# Patient Record
Sex: Female | Born: 1995 | Race: Black or African American | Hispanic: No | Marital: Single | Smoking: Current some day smoker
Health system: Southern US, Community
[De-identification: ages and names within clinical notes are randomized; demographics above are authoritative.]

## PROBLEM LIST (undated history)

## (undated) ENCOUNTER — Inpatient Hospital Stay (HOSPITAL_COMMUNITY): Payer: Self-pay

## (undated) DIAGNOSIS — I10 Essential (primary) hypertension: Secondary | ICD-10-CM

## (undated) HISTORY — PX: NO PAST SURGERIES: SHX2092

---
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
## 2017-01-27 ENCOUNTER — Inpatient Hospital Stay (HOSPITAL_COMMUNITY): Payer: Managed Care, Other (non HMO)

## 2017-01-27 ENCOUNTER — Other Ambulatory Visit: Payer: Self-pay

## 2017-01-27 ENCOUNTER — Encounter (HOSPITAL_COMMUNITY): Payer: Self-pay | Admitting: *Deleted

## 2017-01-27 ENCOUNTER — Inpatient Hospital Stay (HOSPITAL_COMMUNITY)
Admission: AD | Admit: 2017-01-27 | Discharge: 2017-01-27 | Disposition: A | Discharge status: Home / Self Care | Payer: Managed Care, Other (non HMO) | Source: Ambulatory Visit | Attending: Obstetrics and Gynecology | Admitting: Obstetrics and Gynecology

## 2017-01-27 DIAGNOSIS — O23591 Infection of other part of genital tract in pregnancy, first trimester: Secondary | ICD-10-CM

## 2017-01-27 DIAGNOSIS — N898 Other specified noninflammatory disorders of vagina: Secondary | ICD-10-CM | POA: Insufficient documentation

## 2017-01-27 DIAGNOSIS — O209 Hemorrhage in early pregnancy, unspecified: Secondary | ICD-10-CM | POA: Insufficient documentation

## 2017-01-27 DIAGNOSIS — O26891 Other specified pregnancy related conditions, first trimester: Secondary | ICD-10-CM | POA: Insufficient documentation

## 2017-01-27 DIAGNOSIS — R109 Unspecified abdominal pain: Secondary | ICD-10-CM | POA: Insufficient documentation

## 2017-01-27 DIAGNOSIS — Z3A01 Less than 8 weeks gestation of pregnancy: Secondary | ICD-10-CM | POA: Diagnosis not present

## 2017-01-27 DIAGNOSIS — Z3491 Encounter for supervision of normal pregnancy, unspecified, first trimester: Secondary | ICD-10-CM

## 2017-01-27 DIAGNOSIS — O98311 Other infections with a predominantly sexual mode of transmission complicating pregnancy, first trimester: Secondary | ICD-10-CM | POA: Insufficient documentation

## 2017-01-27 DIAGNOSIS — A5901 Trichomonal vulvovaginitis: Secondary | ICD-10-CM | POA: Insufficient documentation

## 2017-01-27 DIAGNOSIS — M549 Dorsalgia, unspecified: Secondary | ICD-10-CM | POA: Insufficient documentation

## 2017-01-27 HISTORY — DX: Essential (primary) hypertension: I10

## 2017-01-27 LAB — ABO/RH: ABO/RH(D): O POS

## 2017-01-27 LAB — WET PREP, GENITAL
CLUE CELLS WET PREP: NONE SEEN
Sperm: NONE SEEN
YEAST WET PREP: NONE SEEN

## 2017-01-27 LAB — URINALYSIS, ROUTINE W REFLEX MICROSCOPIC
Bilirubin Urine: NEGATIVE
GLUCOSE, UA: NEGATIVE mg/dL
Hgb urine dipstick: NEGATIVE
Ketones, ur: 5 mg/dL — AB
Nitrite: NEGATIVE
PH: 7 (ref 5.0–8.0)
Protein, ur: NEGATIVE mg/dL
Specific Gravity, Urine: 1.011 (ref 1.005–1.030)

## 2017-01-27 LAB — CBC
HCT: 36.4 % (ref 36.0–46.0)
HEMOGLOBIN: 11.9 g/dL — AB (ref 12.0–15.0)
MCH: 30.4 pg (ref 26.0–34.0)
MCHC: 32.7 g/dL (ref 30.0–36.0)
MCV: 93.1 fL (ref 78.0–100.0)
Platelets: 295 10*3/uL (ref 150–400)
RBC: 3.91 MIL/uL (ref 3.87–5.11)
RDW: 13.2 % (ref 11.5–15.5)
WBC: 4.8 10*3/uL (ref 4.0–10.5)

## 2017-01-27 LAB — POCT PREGNANCY, URINE: Preg Test, Ur: POSITIVE — AB

## 2017-01-27 LAB — HCG, QUANTITATIVE, PREGNANCY: hCG, Beta Chain, Quant, S: 45867 m[IU]/mL — ABNORMAL HIGH (ref <5)

## 2017-01-27 LAB — OB RESULTS CONSOLE GC/CHLAMYDIA: Gonorrhea: NEGATIVE

## 2017-01-27 MED ORDER — METRONIDAZOLE 500 MG PO TABS
2000.0000 mg | ORAL_TABLET | Freq: Once | ORAL | Status: AC
  Administered 2017-01-27: 2000 mg via ORAL
  Filled 2017-01-27: qty 4

## 2017-01-27 NOTE — MAU Note (Signed)
Just had preg confirmed at HD.  Having pain in low back and lower abd, cramping and feels like stabbing and has been kicked.

## 2017-01-27 NOTE — MAU Provider Note (Signed)
History     CSN: [id number]  Arrival date and time: 01/27/17 1209   First Provider Initiated Contact with Patient 01/27/17 1414      Chief Complaint  Patient presents with  . Abdominal Pain  . Back Pain  . Possible Pregnancy   HPI Shelley Guzman is a 21 y.o. G1P0 at 6328w3d by LMP who presents with abdominal pain. Reports lower abdominal cramping since 10/29. Pain has been constant. Radiates from bilateral lower abdomen around to lower back. Describes as sharp pain. Rates pain 4/10. Has been taking tylenol with mild relief. Endorses vaginal discharge. Describes as thin white discharge, no odor. Denies vaginal irritation or itching. Denies vaginal bleeding.   OB History    Gravida Para Term Preterm AB Living   1             SAB TAB Ectopic Multiple Live Births                  No past medical history on file.  No past surgical history on file.  No family history on file.  Social History   Tobacco Use  . Smoking status: Not on file  Substance Use Topics  . Alcohol use: Not on file  . Drug use: Not on file    Allergies: Allergies not on file  No medications prior to admission.    Review of Systems  Constitutional: Negative.   Gastrointestinal: Positive for abdominal pain. Negative for constipation, diarrhea, nausea and vomiting.  Genitourinary: Positive for vaginal discharge. Negative for dyspareunia, dysuria, vaginal bleeding and vaginal pain.   Physical Exam   Blood pressure 125/60, pulse 73, temperature 98.3 F (36.8 C), temperature source Oral, resp. rate 16, weight 280 lb 12 oz (127.3 kg), last menstrual period 12/06/2016, SpO2 100 %.  Physical Exam  Nursing note and vitals reviewed. Constitutional: She is oriented to person, place, and time. She appears well-developed and well-nourished. No distress.  HENT:  Head: Normocephalic and atraumatic.  Eyes: Conjunctivae are normal. Right eye exhibits no discharge. Left eye exhibits no discharge. No scleral  icterus.  Neck: Normal range of motion.  Respiratory: Effort normal. No respiratory distress.  GI: Soft. There is no tenderness.  Genitourinary: Uterus normal. Cervix exhibits no motion tenderness and no friability. Right adnexum displays no mass and no tenderness. Left adnexum displays no mass and no tenderness. No bleeding in the vagina. Vaginal discharge (moderate amount of yellow frothy discharge) found.  Genitourinary Comments: Cervix closed  Neurological: She is alert and oriented to person, place, and time.  Skin: Skin is warm and dry. She is not diaphoretic.  Psychiatric: She has a normal mood and affect. Her behavior is normal. Judgment and thought content normal.    MAU Course  Procedures Results for orders placed or performed during the hospital encounter of 01/27/17 (from the past 24 hour(s))  Urinalysis, Routine w reflex microscopic     Status: Abnormal   Collection Time: 01/27/17 12:39 PM  Result Value Ref Range   Color, Urine YELLOW YELLOW   APPearance HAZY (A) CLEAR   Specific Gravity, Urine 1.011 1.005 - 1.030   pH 7.0 5.0 - 8.0   Glucose, UA NEGATIVE NEGATIVE mg/dL   Hgb urine dipstick NEGATIVE NEGATIVE   Bilirubin Urine NEGATIVE NEGATIVE   Ketones, ur 5 (A) NEGATIVE mg/dL   Protein, ur NEGATIVE NEGATIVE mg/dL   Nitrite NEGATIVE NEGATIVE   Leukocytes, UA LARGE (A) NEGATIVE   RBC / HPF 0-5 0 - 5 RBC/hpf  WBC, UA 6-30 0 - 5 WBC/hpf   Bacteria, UA FEW (A) NONE SEEN   Squamous Epithelial / LPF 6-30 (A) NONE SEEN   Mucus PRESENT   Pregnancy, urine POC     Status: Abnormal   Collection Time: 01/27/17 12:53 PM  Result Value Ref Range   Preg Test, Ur POSITIVE (A) NEGATIVE  CBC     Status: Abnormal   Collection Time: 01/27/17  1:36 PM  Result Value Ref Range   WBC 4.8 4.0 - 10.5 K/uL   RBC 3.91 3.87 - 5.11 MIL/uL   Hemoglobin 11.9 (L) 12.0 - 15.0 g/dL   HCT 40.936.4 81.136.0 - 91.446.0 %   MCV 93.1 78.0 - 100.0 fL   MCH 30.4 26.0 - 34.0 pg   MCHC 32.7 30.0 - 36.0 g/dL    RDW 78.213.2 95.611.5 - 21.315.5 %   Platelets 295 150 - 400 K/uL  ABO/Rh     Status: None (Preliminary result)   Collection Time: 01/27/17  1:36 PM  Result Value Ref Range   ABO/RH(D) O POS   hCG, quantitative, pregnancy     Status: Abnormal   Collection Time: 01/27/17  1:36 PM  Result Value Ref Range   hCG, Beta Chain, Quant, S 45,867 (H) <5 mIU/mL  Wet prep, genital     Status: Abnormal   Collection Time: 01/27/17  2:24 PM  Result Value Ref Range   Yeast Wet Prep HPF POC NONE SEEN NONE SEEN   Trich, Wet Prep PRESENT (A) NONE SEEN   Clue Cells Wet Prep HPF POC NONE SEEN NONE SEEN   WBC, Wet Prep HPF POC MODERATE (A) NONE SEEN   Sperm NONE SEEN    [place] Ob Comp Less 14 Wks  Result Date: 01/27/2017 CLINICAL DATA:  21 year old pregnant female with low back and abdominal pain. Quantitative beta HCG [id number]. EDC by LMP: 09/12/2017, projecting to an expected gestational age of [redacted] weeks 3 days. EXAM: OBSTETRIC <14 WK US AND TRANSVAGINAL OB US TECHNIQUE: Both transabdominal and transvaginal ultrasound examinations were performed for complete evaluation of the gestation as well as the maternal uterus, adnexal regions, and pelvic cul-de-sac. Transvaginal technique was performed to assess early pregnancy. COMPARISON:  None. FINDINGS: Intrauterine gestational sac: Single intrauterine gestational sac is normal in size, shape and position. Yolk sac:  Visualized. Embryo:  Visualized. Embryonic Cardiac Activity: Regular rate and rhythm. Embryonic Heart Rate: 161  bpm CRL:  15.3  mm   7 w   6 d                  US EDC: 09/09/2017 Subchorionic hemorrhage: Small perigestational bleed in the lower cavity measures 1.6 x 0.7 x 0.7 cm. Maternal uterus/adnexae: Right ovary measures 2.8 x 1.9 x 1.6 cm. Left ovary is not visualized. No abnormal adnexal masses. No abnormal free fluid in the pelvis. IMPRESSION: 1. Single living intrauterine gestation at 7 weeks 6 days by crown-rump length, concordant with provided menstrual dating. 2.  Small perigestational bleed in the lower cavity. Normal embryonic cardiac activity . 3. Normal right ovary. Nonvisualization of left ovary. No adnexal masses. Electronically Signed   By: Delbert PhenixJason A Poff M.D.   On: 01/27/2017 16:06   [place] Ob Transvaginal  Result Date: 01/27/2017 CLINICAL DATA:  21 year old pregnant female with low back and abdominal pain. Quantitative beta HCG [id number]. EDC by LMP: 09/12/2017, projecting to an expected gestational age of [redacted] weeks 3 days. EXAM: OBSTETRIC <14 WK US AND TRANSVAGINAL OB US TECHNIQUE: Both transabdominal  and transvaginal ultrasound examinations were performed for complete evaluation of the gestation as well as the maternal uterus, adnexal regions, and pelvic cul-de-sac. Transvaginal technique was performed to assess early pregnancy. COMPARISON:  None. FINDINGS: Intrauterine gestational sac: Single intrauterine gestational sac is normal in size, shape and position. Yolk sac:  Visualized. Embryo:  Visualized. Embryonic Cardiac Activity: Regular rate and rhythm. Embryonic Heart Rate: 161  bpm CRL:  15.3  mm   7 w   6 d                  [place] EDC: 09/09/2017 Subchorionic hemorrhage: Small perigestational bleed in the lower cavity measures 1.6 x 0.7 x 0.7 cm. Maternal uterus/adnexae: Right ovary measures 2.8 x 1.9 x 1.6 cm. Left ovary is not visualized. No abnormal adnexal masses. No abnormal free fluid in the pelvis. IMPRESSION: 1. Single living intrauterine gestation at 7 weeks 6 days by crown-rump length, concordant with provided menstrual dating. 2. Small perigestational bleed in the lower cavity. Normal embryonic cardiac activity . 3. Normal right ovary. Nonvisualization of left ovary. No adnexal masses. Electronically Signed   By: Delbert Phenix M.D.   On: 01/27/2017 16:06     MDM +UPT UA, wet prep, GC/chlamydia, CBC, ABO/Rh, quant hCG, HIV, and [place] today to rule out ectopic pregnancy O positive Ultrasound shows SIUP with cardiac activity Wet prep + trich Flagyl 2 gm PO  given in MAU   Assessment and Plan  A: 1. Normal IUP (intrauterine pregnancy) on prenatal ultrasound, first trimester   2. Abdominal pain during pregnancy in first trimester   3. Less than [redacted] weeks gestation of pregnancy   4. Trichomonal vaginitis during pregnancy in first trimester    P: Discharge home EPT rx & info sheet given No intercourse x 1 week after treatment Discussed reasons to return to MAU Start prenatal care GC/CT pending   Judeth Horn 01/27/2017, 2:14 PM

## 2017-01-27 NOTE — Discharge Instructions (Signed)
Trichomoniasis °Trichomoniasis is an STI (sexually transmitted infection) that can affect both women and men. In women, the outer area of the female genitalia (vulva) and the vagina are affected. In men, the penis is mainly affected, but the prostate and other reproductive organs can also be involved. This condition can be treated with medicine. It often has no symptoms (is asymptomatic), especially in men. °What are the causes? °This condition is caused by an organism called Trichomonas vaginalis. Trichomoniasis most often spreads from person to person (is contagious) through sexual contact. °What increases the risk? °The following factors may make you more likely to develop this condition: °· Having unprotected sexual intercourse. °· Having sexual intercourse with a partner who has trichomoniasis. °· Having multiple sexual partners. °· Having had previous trichomoniasis infections or other STIs. ° °What are the signs or symptoms? °In women, symptoms of trichomoniasis include: °· Abnormal vaginal discharge that is clear, white, gray, or yellow-green and foamy and has an unusual "fishy" odor. °· Itching and irritation of the vagina and vulva. °· Burning or pain during urination or sexual intercourse. °· Genital redness and swelling. ° °In men, symptoms of trichomoniasis include: °· Penile discharge that may be foamy or contain pus. °· Pain in the penis. This may happen only when urinating. °· Itching or irritation inside the penis. °· Burning after urination or ejaculation. ° °How is this diagnosed? °In women, this condition may be found during a routine Pap test or physical exam. It may be found in men during a routine physical exam. Your health care provider may perform tests to help diagnose this infection, such as: °· Urine tests (men and women). °· The following in women: °? Testing the pH of the vagina. °? A vaginal swab test that checks for the Trichomonas vaginalis organism. °? Testing vaginal  secretions. ° °Your health care provider may test you for other STIs, including HIV (human immunodeficiency virus). °How is this treated? °This condition is treated with medicine taken by mouth (orally), such as metronidazole or tinidazole to fight the infection. Your sexual partner(s) may also need to be tested and treated. °· If you are a woman and you plan to become pregnant or think you may be pregnant, tell your health care provider right away. Some medicines that are used to treat the infection should not be taken during pregnancy. ° °Your health care provider may recommend over-the-counter medicines or creams to help relieve itching or irritation. You may be tested for infection again 3 months after treatment. °Follow these instructions at home: °· Take and use over-the-counter and prescription medicines, including creams, only as told by your health care provider. °· Do not have sexual intercourse until one week after you finish your medicine, or until your health care provider approves. Ask your health care provider when you may resume sexual intercourse. °· (Women) Do not douche or wear tampons while you have the infection. °· Discuss your infection with your sexual partner(s). Make sure that your partner gets tested and treated, if necessary. °· Keep all follow-up visits as told by your health care provider. This is important. °How is this prevented? °· Use condoms every time you have sex. Using condoms correctly and consistently can help protect against STIs. °· Avoid having multiple sexual partners. °· Talk with your sexual partner about any symptoms that either of you may have, as well as any history of STIs. °· Get tested for STIs and STDs (sexually transmitted diseases) before you have sex. Ask your partner   to do the same.  Do not have sexual contact if you have symptoms of trichomoniasis or another STI. Contact a health care provider if:  You still have symptoms after you finish your  medicine.  You develop pain in your abdomen.  You have pain when you urinate.  You have bleeding after sexual intercourse.  You develop a rash.  You feel nauseous or you vomit.  You plan to become pregnant or think you may be pregnant. Summary  Trichomoniasis is an STI (sexually transmitted infection) that can affect both women and men.  This condition often has no symptoms (is asymptomatic), especially in men.  You should not have sexual intercourse until one week after you finish your medicine, or until your health care provider approves. Ask your health care provider when you may resume sexual intercourse.  Discuss your infection with your sexual partner. Make sure that your partner gets tested and treated, if necessary. This information is not intended to replace advice given to you by your health care provider. Make sure you discuss any questions you have with your health care provider. Document Released: 08/24/2000 Document Revised: 01/22/2016 Document Reviewed: 01/22/2016 Elsevier Interactive Patient Education  2017 [employer] First Trimester of Pregnancy The first trimester of pregnancy is from week 1 until the end of week 13 (months 1 through 3). A week after a sperm fertilizes an egg, the egg will implant on the wall of the uterus. This embryo will begin to develop into a baby. Genes from you and your partner will form the baby. The female genes will determine whether the baby will be a boy or a girl. At 6-8 weeks, the eyes and face will be formed, and the heartbeat can be seen on ultrasound. At the end of 12 weeks, all the baby's organs will be formed. Now that you are pregnant, you will want to do everything you can to have a healthy baby. Two of the most important things are to get good prenatal care and to follow your health care provider's instructions. Prenatal care is all the medical care you receive before the baby's birth. This care will help prevent, find, and treat  any problems during the pregnancy and childbirth. Body changes during your first trimester Your body goes through many changes during pregnancy. The changes vary from woman to woman.  You may gain or lose a couple of pounds at first.  You may feel sick to your stomach (nauseous) and you may throw up (vomit). If the vomiting is uncontrollable, call your health care provider.  You may tire easily.  You may develop headaches that can be relieved by medicines. All medicines should be approved by your health care provider.  You may urinate more often. Painful urination may mean you have a bladder infection.  You may develop heartburn as a result of your pregnancy.  You may develop constipation because certain hormones are causing the muscles that push stool through your intestines to slow down.  You may develop hemorrhoids or swollen veins (varicose veins).  Your breasts may begin to grow larger and become tender. Your nipples may stick out more, and the tissue that surrounds them (areola) may become darker.  Your gums may bleed and may be sensitive to brushing and flossing.  Dark spots or blotches (chloasma, mask of pregnancy) may develop on your face. This will likely fade after the baby is born.  Your menstrual periods will stop.  You may have a loss of appetite.  You  may develop cravings for certain kinds of food.  You may have changes in your emotions from day to day, such as being excited to be pregnant or being concerned that something may go wrong with the pregnancy and baby.  You may have more vivid and strange dreams.  You may have changes in your hair. These can include thickening of your hair, rapid growth, and changes in texture. Some women also have hair loss during or after pregnancy, or hair that feels dry or thin. Your hair will most likely return to normal after your baby is born.  What to expect at prenatal visits During a routine prenatal visit:  You will be  weighed to make sure you and the baby are growing normally.  Your blood pressure will be taken.  Your abdomen will be measured to track your baby's growth.  The fetal heartbeat will be listened to between weeks 10 and 14 of your pregnancy.  Test results from any previous visits will be discussed.  Your health care provider may ask you:  How you are feeling.  If you are feeling the baby move.  If you have had any abnormal symptoms, such as leaking fluid, bleeding, severe headaches, or abdominal cramping.  If you are using any tobacco products, including cigarettes, chewing tobacco, and electronic cigarettes.  If you have any questions.  Other tests that may be performed during your first trimester include:  Blood tests to find your blood type and to check for the presence of any previous infections. The tests will also be used to check for low iron levels (anemia) and protein on red blood cells (Rh antibodies). Depending on your risk factors, or if you previously had diabetes during pregnancy, you may have tests to check for high blood sugar that affects pregnant women (gestational diabetes).  Urine tests to check for infections, diabetes, or protein in the urine.  An ultrasound to confirm the proper growth and development of the baby.  Fetal screens for spinal cord problems (spina bifida) and Down syndrome.  HIV (human immunodeficiency virus) testing. Routine prenatal testing includes screening for HIV, unless you choose not to have this test.  You may need other tests to make sure you and the baby are doing well.  Follow these instructions at home: Medicines  Follow your health care provider's instructions regarding medicine use. Specific medicines may be either safe or unsafe to take during pregnancy.  Take a prenatal vitamin that contains at least 600 micrograms (mcg) of folic acid.  If you develop constipation, try taking a stool softener if your health care provider  approves. Eating and drinking  Eat a balanced diet that includes fresh fruits and vegetables, whole grains, good sources of protein such as meat, eggs, or tofu, and low-fat dairy. Your health care provider will help you determine the amount of weight gain that is right for you.  Avoid raw meat and uncooked cheese. These carry germs that can cause birth defects in the baby.  Eating four or five small meals rather than three large meals a day may help relieve nausea and vomiting. If you start to feel nauseous, eating a few soda crackers can be helpful. Drinking liquids between meals, instead of during meals, also seems to help ease nausea and vomiting.  Limit foods that are high in fat and processed sugars, such as fried and sweet foods.  To prevent constipation: ? Eat foods that are high in fiber, such as fresh fruits and vegetables, whole grains,  and beans. ? Drink enough fluid to keep your urine clear or pale yellow. Activity  Exercise only as directed by your health care provider. Most women can continue their usual exercise routine during pregnancy. Try to exercise for 30 minutes at least 5 days a week. Exercising will help you: ? Control your weight. ? Stay in shape. ? Be prepared for labor and delivery.  Experiencing pain or cramping in the lower abdomen or lower back is a good sign that you should stop exercising. Check with your health care provider before continuing with normal exercises.  Try to avoid standing for long periods of time. Move your legs often if you must stand in one place for a long time.  Avoid heavy lifting.  Wear low-heeled shoes and practice good posture.  You may continue to have sex unless your health care provider tells you not to. Relieving pain and discomfort  Wear a good support bra to relieve breast tenderness.  Take warm sitz baths to soothe any pain or discomfort caused by hemorrhoids. Use hemorrhoid cream if your health care provider  approves.  Rest with your legs elevated if you have leg cramps or low back pain.  If you develop varicose veins in your legs, wear support hose. Elevate your feet for 15 minutes, 3-4 times a day. Limit salt in your diet. Prenatal care  Schedule your prenatal visits by the twelfth week of pregnancy. They are usually scheduled monthly at first, then more often in the last 2 months before delivery.  Write down your questions. Take them to your prenatal visits.  Keep all your prenatal visits as told by your health care provider. This is important. Safety  Wear your seat belt at all times when driving.  Make a list of emergency phone numbers, including numbers for family, friends, the hospital, and police and fire departments. General instructions  Ask your health care provider for a referral to a local prenatal education class. Begin classes no later than the beginning of month 6 of your pregnancy.  Ask for help if you have counseling or nutritional needs during pregnancy. Your health care provider can offer advice or refer you to specialists for help with various needs.  Do not use hot tubs, steam rooms, or saunas.  Do not douche or use tampons or scented sanitary pads.  Do not cross your legs for long periods of time.  Avoid cat litter boxes and soil used by cats. These carry germs that can cause birth defects in the baby and possibly loss of the fetus by miscarriage or stillbirth.  Avoid all smoking, herbs, alcohol, and medicines not prescribed by your health care provider. Chemicals in these products affect the formation and growth of the baby.  Do not use any products that contain nicotine or tobacco, such as cigarettes and e-cigarettes. If you need help quitting, ask your health care provider. You may receive counseling support and other resources to help you quit.  Schedule a dentist appointment. At home, brush your teeth with a soft toothbrush and be gentle when you  floss. Contact a health care provider if:  You have dizziness.  You have mild pelvic cramps, pelvic pressure, or nagging pain in the abdominal area.  You have persistent nausea, vomiting, or diarrhea.  You have a bad smelling vaginal discharge.  You have pain when you urinate.  You notice increased swelling in your face, hands, legs, or ankles.  You are exposed to fifth disease or chickenpox.  You are  exposed to [place] measles (rubella) and have never had it. Get help right away if:  You have a fever.  You are leaking fluid from your vagina.  You have spotting or bleeding from your vagina.  You have severe abdominal cramping or pain.  You have rapid weight gain or loss.  You vomit blood or material that looks like coffee grounds.  You develop a severe headache.  You have shortness of breath.  You have any kind of trauma, such as from a fall or a car accident. Summary  The first trimester of pregnancy is from week 1 until the end of week 13 (months 1 through 3).  Your body goes through many changes during pregnancy. The changes vary from woman to woman.  You will have routine prenatal visits. During those visits, your health care provider will examine you, discuss any test results you may have, and talk with you about how you are feeling. This information is not intended to replace advice given to you by your health care provider. Make sure you discuss any questions you have with your health care provider. Document Released: 02/22/2001 Document Revised: 02/10/2016 Document Reviewed: 02/10/2016 Elsevier Interactive Patient Education  2017 [employer].      [facility] Providers   [facility]       Phone: [phone]  [facility] for [facility] [place] Phone: [phone]  [facility] [place]  Phone: [phone]  [facility] for [facility]  Phone:  [phone]  [facility] [place]  Phone: [phone]  [facility]       Phone: [phone]  [facility]    Phone: [phone]   [facility] [place]    Phone: [phone]  [facility]    Phone: [phone]  [facility]    Phone: [phone]   [facility]  Phone: [phone]  [facility]    Phone: [phone]  [facility]   Phone: [phone]  [facility]    Phone: [phone]

## 2017-01-30 LAB — GC/CHLAMYDIA PROBE AMP (~~LOC~~) NOT AT ARMC
CHLAMYDIA, DNA PROBE: NEGATIVE
NEISSERIA GONORRHEA: NEGATIVE

## 2017-03-14 NOTE — L&D Delivery Note (Signed)
Patient is 22 y.o. G1P0 6918w1d admitted for SROM   Delivery Note At 8:12 PM a viable female was delivered via Vaginal, Spontaneous (Presentation: OA;  ).  APGAR: 7, 8; weight 8 lb 0.4 oz (3640 g).   Placenta status: delievered intact ,trailing membranes .  Cord: 3V   Anesthesia:  Lidocaine 1% w/o epinephrine Episiotomy: None Lacerations: Perineal;1st degree, periclitoral Suture Repair: 3.0 and 4.0 monocryl Est. Blood Loss (mL):  680 mL  Mom to postpartum.  Baby to Couplet care / Skin to Skin.  Upon arrival patient was complete and pushing. She pushed with good maternal effort to deliver a healthy baby boy. Baby delivered without difficulty, was noted to have good tone and place on maternal abdomen for oral suctioning, drying and stimulation. Delayed cord clamping performed. Placenta delivered intact with 3V cord. Vaginal canal and perineum was inspected and 1st degree and periclitoral lacerations were found. A rubber catheter was placed, then the periclitoral laceration was repaired using 4.0 monocryl with 4 interrupted sutures. The 1st degree perineal laceration was repaired with a running suture using 3.0 monocryl. Both lacerations were hemostatic after repair. Pitocin was started and uterus massaged until bleeding slowed. Counts of sharps, instruments, and lap pads were all correct.   Garnette GunnerAaron B Thompson, MD PGY-1 6/19/20199:47 PM

## 2017-03-21 LAB — OB RESULTS CONSOLE RPR: RPR: NONREACTIVE

## 2017-03-21 LAB — OB RESULTS CONSOLE ANTIBODY SCREEN: ANTIBODY SCREEN: NEGATIVE

## 2017-03-21 LAB — OB RESULTS CONSOLE RUBELLA ANTIBODY, IGM: Rubella: IMMUNE

## 2017-03-21 LAB — OB RESULTS CONSOLE ABO/RH: RH Type: POSITIVE

## 2017-03-21 LAB — OB RESULTS CONSOLE HIV ANTIBODY (ROUTINE TESTING): HIV: NONREACTIVE

## 2017-03-21 LAB — OB RESULTS CONSOLE HEPATITIS B SURFACE ANTIGEN: HEP B S AG: NEGATIVE

## 2017-08-30 ENCOUNTER — Inpatient Hospital Stay (HOSPITAL_COMMUNITY)
Admission: AD | Admit: 2017-08-30 | Discharge: 2017-09-01 | DRG: 806 | Disposition: A | Discharge status: Home / Self Care | Payer: Medicaid Other | Attending: Obstetrics & Gynecology | Admitting: Obstetrics & Gynecology

## 2017-08-30 ENCOUNTER — Other Ambulatory Visit: Payer: Self-pay

## 2017-08-30 ENCOUNTER — Encounter (HOSPITAL_COMMUNITY): Payer: Self-pay

## 2017-08-30 DIAGNOSIS — D62 Acute posthemorrhagic anemia: Secondary | ICD-10-CM | POA: Diagnosis not present

## 2017-08-30 DIAGNOSIS — O99324 Drug use complicating childbirth: Secondary | ICD-10-CM | POA: Diagnosis present

## 2017-08-30 DIAGNOSIS — F1290 Cannabis use, unspecified, uncomplicated: Secondary | ICD-10-CM | POA: Diagnosis present

## 2017-08-30 DIAGNOSIS — O99334 Smoking (tobacco) complicating childbirth: Secondary | ICD-10-CM | POA: Diagnosis present

## 2017-08-30 DIAGNOSIS — O26893 Other specified pregnancy related conditions, third trimester: Secondary | ICD-10-CM | POA: Diagnosis present

## 2017-08-30 DIAGNOSIS — R030 Elevated blood-pressure reading, without diagnosis of hypertension: Secondary | ICD-10-CM | POA: Diagnosis not present

## 2017-08-30 DIAGNOSIS — Z3A38 38 weeks gestation of pregnancy: Secondary | ICD-10-CM | POA: Diagnosis not present

## 2017-08-30 DIAGNOSIS — O99824 Streptococcus B carrier state complicating childbirth: Principal | ICD-10-CM | POA: Diagnosis present

## 2017-08-30 DIAGNOSIS — O9081 Anemia of the puerperium: Secondary | ICD-10-CM | POA: Diagnosis not present

## 2017-08-30 DIAGNOSIS — F17290 Nicotine dependence, other tobacco product, uncomplicated: Secondary | ICD-10-CM | POA: Diagnosis present

## 2017-08-30 DIAGNOSIS — Z3483 Encounter for supervision of other normal pregnancy, third trimester: Secondary | ICD-10-CM | POA: Diagnosis present

## 2017-08-30 LAB — CBC
HEMATOCRIT: 36.7 % (ref 36.0–46.0)
HEMOGLOBIN: 12.3 g/dL (ref 12.0–15.0)
MCH: 30.6 pg (ref 26.0–34.0)
MCHC: 33.5 g/dL (ref 30.0–36.0)
MCV: 91.3 fL (ref 78.0–100.0)
Platelets: 332 10*3/uL (ref 150–400)
RBC: 4.02 MIL/uL (ref 3.87–5.11)
RDW: 13.4 % (ref 11.5–15.5)
WBC: 9.4 10*3/uL (ref 4.0–10.5)

## 2017-08-30 LAB — TYPE AND SCREEN
ABO/RH(D): O POS
ANTIBODY SCREEN: NEGATIVE

## 2017-08-30 MED ORDER — SENNOSIDES-DOCUSATE SODIUM 8.6-50 MG PO TABS
2.0000 | ORAL_TABLET | ORAL | Status: DC
  Administered 2017-08-30 – 2017-08-31 (×2): 2 via ORAL
  Filled 2017-08-30 (×2): qty 2

## 2017-08-30 MED ORDER — LACTATED RINGERS IV SOLN
INTRAVENOUS | Status: DC
  Administered 2017-08-30: 19:00:00 via INTRAVENOUS

## 2017-08-30 MED ORDER — OXYTOCIN BOLUS FROM INFUSION
500.0000 mL | Freq: Once | INTRAVENOUS | Status: AC
  Administered 2017-08-30: 500 mL via INTRAVENOUS

## 2017-08-30 MED ORDER — FLEET ENEMA 7-19 GM/118ML RE ENEM: 1.0000 | ENEMA | RECTAL | Status: DC | PRN

## 2017-08-30 MED ORDER — SODIUM CHLORIDE 0.9 % IV SOLN
5.0000 10*6.[IU] | Freq: Once | INTRAVENOUS | Status: AC
  Administered 2017-08-30: 5 10*6.[IU] via INTRAVENOUS
  Filled 2017-08-30: qty 5

## 2017-08-30 MED ORDER — DIPHENHYDRAMINE HCL 25 MG PO CAPS
25.0000 mg | ORAL_CAPSULE | Freq: Four times a day (QID) | ORAL | Status: DC | PRN
Start: 2017-08-30 — End: 2017-09-02

## 2017-08-30 MED ORDER — PENICILLIN G POT IN DEXTROSE 60000 UNIT/ML IV SOLN
3.0000 10*6.[IU] | INTRAVENOUS | Status: DC
  Filled 2017-08-30: qty 50

## 2017-08-30 MED ORDER — COCONUT OIL OIL: 1.0000 "application " | TOPICAL_OIL | Status: DC | PRN

## 2017-08-30 MED ORDER — DIBUCAINE 1 % RE OINT: 1.0000 "application " | TOPICAL_OINTMENT | RECTAL | Status: DC | PRN

## 2017-08-30 MED ORDER — OXYCODONE-ACETAMINOPHEN 5-325 MG PO TABS: 2.0000 | ORAL_TABLET | ORAL | Status: DC | PRN

## 2017-08-30 MED ORDER — ZOLPIDEM TARTRATE 5 MG PO TABS: 5.0000 mg | ORAL_TABLET | Freq: Every evening | ORAL | Status: DC | PRN

## 2017-08-30 MED ORDER — ACETAMINOPHEN 325 MG PO TABS: 650.0000 mg | ORAL_TABLET | ORAL | Status: DC | PRN

## 2017-08-30 MED ORDER — IBUPROFEN 600 MG PO TABS
600.0000 mg | ORAL_TABLET | Freq: Four times a day (QID) | ORAL | Status: DC
  Administered 2017-08-30 – 2017-09-01 (×8): 600 mg via ORAL
  Filled 2017-08-30 (×8): qty 1

## 2017-08-30 MED ORDER — SOD CITRATE-CITRIC ACID 500-334 MG/5ML PO SOLN: 30.0000 mL | ORAL | Status: DC | PRN

## 2017-08-30 MED ORDER — LACTATED RINGERS IV SOLN: 500.0000 mL | INTRAVENOUS | Status: DC | PRN

## 2017-08-30 MED ORDER — OXYCODONE-ACETAMINOPHEN 5-325 MG PO TABS: 1.0000 | ORAL_TABLET | ORAL | Status: DC | PRN

## 2017-08-30 MED ORDER — ONDANSETRON HCL 4 MG PO TABS: 4.0000 mg | ORAL_TABLET | ORAL | Status: DC | PRN

## 2017-08-30 MED ORDER — PRENATAL MULTIVITAMIN CH
1.0000 | ORAL_TABLET | Freq: Every day | ORAL | Status: DC
  Administered 2017-08-31 – 2017-09-01 (×2): 1 via ORAL
  Filled 2017-08-30: qty 1

## 2017-08-30 MED ORDER — LIDOCAINE HCL (PF) 1 % IJ SOLN
30.0000 mL | INTRAMUSCULAR | Status: DC | PRN
  Administered 2017-08-30: 30 mL via SUBCUTANEOUS
  Filled 2017-08-30: qty 30

## 2017-08-30 MED ORDER — ONDANSETRON HCL 4 MG/2ML IJ SOLN
4.0000 mg | Freq: Four times a day (QID) | INTRAMUSCULAR | Status: DC | PRN
  Administered 2017-08-30: 4 mg via INTRAVENOUS
  Filled 2017-08-30: qty 2

## 2017-08-30 MED ORDER — SIMETHICONE 80 MG PO CHEW: 80.0000 mg | CHEWABLE_TABLET | ORAL | Status: DC | PRN

## 2017-08-30 MED ORDER — BENZOCAINE-MENTHOL 20-0.5 % EX AERO
1.0000 "application " | INHALATION_SPRAY | CUTANEOUS | Status: DC | PRN
  Administered 2017-09-01: 1 via TOPICAL
  Filled 2017-08-30 (×2): qty 56

## 2017-08-30 MED ORDER — ONDANSETRON HCL 4 MG/2ML IJ SOLN: 4.0000 mg | INTRAMUSCULAR | Status: DC | PRN

## 2017-08-30 MED ORDER — WITCH HAZEL-GLYCERIN EX PADS: 1.0000 "application " | MEDICATED_PAD | CUTANEOUS | Status: DC | PRN

## 2017-08-30 MED ORDER — OXYTOCIN 40 UNITS IN LACTATED RINGERS INFUSION - SIMPLE MED
2.5000 [IU]/h | INTRAVENOUS | Status: DC
  Filled 2017-08-30: qty 1000

## 2017-08-30 MED ORDER — TETANUS-DIPHTH-ACELL PERTUSSIS 5-2.5-18.5 LF-MCG/0.5 IM SUSP: 0.5000 mL | Freq: Once | INTRAMUSCULAR | Status: DC

## 2017-08-30 MED ORDER — FENTANYL CITRATE (PF) 100 MCG/2ML IJ SOLN
100.0000 ug | INTRAMUSCULAR | Status: DC | PRN
  Administered 2017-08-30: 100 ug via INTRAVENOUS
  Filled 2017-08-30: qty 2

## 2017-08-30 NOTE — H&P (Addendum)
LABOR AND DELIVERY ADMISSION HISTORY AND PHYSICAL NOTE  Shelley Guzman is a 22 y.o. female G1P0 with IUP at 6536w1d by LMP c/w 15 wk US presenting for SROM at 1215 today with clear fluid. Since that time she has been having contractions increasing in frequency and intensity.  She reports positive fetal movement. She denies vaginal bleeding.  [facility] Pregnancy complications:  - THC use - GBS positive  Past Medical History: Past Medical History:  Diagnosis Date  . Hypertension     Past Surgical History: Past Surgical History:  Procedure Laterality Date  . NO PAST SURGERIES      Obstetrical History: OB History    Gravida  1   Para      Term      Preterm      AB      Living        SAB      TAB      Ectopic      Multiple      Live Births              Social History: Social History   Socioeconomic History  . Marital status: Single    Spouse name: Not on file  . Number of children: Not on file  . Years of education: Not on file  . Highest education level: Not on file  Occupational History  . Not on file  Social Needs  . Financial resource strain: Not on file  . Food insecurity:    Worry: Not on file    Inability: Not on file  . Transportation needs:    Medical: Not on file    Non-medical: Not on file  Tobacco Use  . Smoking status: Current Some Day Smoker    Packs/day: 0.25    Types: Cigars  . Smokeless tobacco: Never Used  Substance and Sexual Activity  . Alcohol use: Yes  . Drug use: No  . Sexual activity: Not on file  Lifestyle  . Physical activity:    Days per week: Not on file    Minutes per session: Not on file  . Stress: Not on file  Relationships  . Social connections:    Talks on phone: Not on file    Gets together: Not on file    Attends religious service: Not on file    Active member of club or organization: Not on file    Attends meetings of clubs or organizations: Not on file     Relationship status: Not on file  Other Topics Concern  . Not on file  Social History Narrative  . Not on file    Family History: History reviewed. No pertinent family history.  Allergies: No Known Allergies  Medications Prior to Admission  Medication Sig Dispense Refill Last Dose  . prenatal vitamin w/FE, FA (PRENATAL 1 + 1) 27-1 MG TABS tablet Take 1 tablet by mouth daily at 12 noon.   08/30/2017 at Unknown time     Review of Systems  All systems reviewed and negative except as stated in HPI  Physical Exam Blood pressure 115/80, pulse 85, temperature 98.4 F (36.9 C), temperature source Oral, resp. rate 20, last menstrual period 12/06/2016, SpO2 100 %. General appearance: alert, oriented, NAD Lungs: normal respiratory effort Heart: regular rate Abdomen: soft, non-tender; gravid, FH appropriate for GA Extremities: No calf swelling or tenderness Presentation: cephalic Fetal monitoring: 130 bpm baseline/moderate variability/+ accel, no decel Uterine activity: q4-5 mins  Dilation: 4 Effacement (%): 90 Station: -1 Exam by:: Dr. Mearl Latin  Prenatal labs: ABO, Rh: --/--/O POS (11/16 1336) Antibody:  unknown Rubella:  unknown RPR:   negative HBsAg:   unknown HIV:   negative GC/Chlamydia: negative GBS:   positive 2-hr GTT: Glucola 129 Genetic screening:  Not done Anatomy US: normal 20 wk [place]  Prenatal Transfer Tool  Maternal Diabetes: No Genetic Screening: Declined Maternal Ultrasounds/Referrals: Normal Fetal Ultrasounds or other Referrals:  None Maternal Substance Abuse:  Yes:  Type: Marijuana Significant Maternal Medications:  None Significant Maternal Lab Results: Lab values include: Group B Strep positive  No results found for this or any previous visit (from the past 24 hour(s)).  Patient Active Problem List   Diagnosis Date Noted  . Normal labor 08/30/2017    Assessment: Shelley Guzman is a 22 y.o. G1P0 at [redacted]w[redacted]d here for SROM at 1215 today.    #Labor: SVE on admission 4/90/-1. Plan for expectant management given SROM. Consider augmentation if needed. Anticipate SVD. #Pain: Per patient request #FWB: Cat 1 FHT #ID:  GBS positive, on penicillin #MOF: breast and bottle #MOC:undecided; pt declined to discuss this on admission given severe pain from labor #Circ:  Yes, planning outpatient #THC use: UDS ordered on admission  [place] P Hipkins 08/30/2017, 7:20 PM  CNM attestation:  I have seen and examined this patient; I agree with above documentation in the resident's note.   Shelley Guzman is a 22 y.o. G1P1001 here for SROM/active labor  PE: BP 139/79 (BP Location: Left Arm)   Pulse (!) 108   Temp 98.4 F (36.9 C) (Oral)   Resp 18   LMP 12/06/2016   SpO2 100%   Breastfeeding? Unknown   Resp: normal effort, no distress Abd: gravid  ROS, labs, PMH reviewed  Plan: [facility] Expectant management Anticipate SVD as pt is laboring precipitously UDS pending (reports THC use in early preg)  Shelley Guzman CNM 08/30/2017, 10:49 PM

## 2017-08-30 NOTE — MAU Note (Signed)
Pt states water broke at 1214 clear fluid, contractions for 2 hours

## 2017-08-31 LAB — CBC
HCT: 27.2 % — ABNORMAL LOW (ref 36.0–46.0)
HEMOGLOBIN: 9.4 g/dL — AB (ref 12.0–15.0)
MCH: 31.4 pg (ref 26.0–34.0)
MCHC: 34.6 g/dL (ref 30.0–36.0)
MCV: 91 fL (ref 78.0–100.0)
Platelets: 295 10*3/uL (ref 150–400)
RBC: 2.99 MIL/uL — ABNORMAL LOW (ref 3.87–5.11)
RDW: 13.4 % (ref 11.5–15.5)
WBC: 13.7 10*3/uL — ABNORMAL HIGH (ref 4.0–10.5)

## 2017-08-31 LAB — RPR: RPR: NONREACTIVE

## 2017-08-31 NOTE — Progress Notes (Signed)
Due to high hospital census and acuity, CSW is unable to meet with MOB to complete assessment for substance use (THC). CSW notes that baby's UDS is negative and will monitor CDS result.  CSW will make report to Child Protective Services if warranted.  Please consult CSW if concerns arise or by MOB's request.  Prince Couey Boyd-Gilyard, MSW, LCSW Clinical Social Work [phone] 

## 2017-08-31 NOTE — Progress Notes (Signed)
Post Partum Day #1 Subjective: no complaints, up ad lib and tolerating PO; breastfeeding and bottlefeeding; unsure re contraception  Objective: Blood pressure 135/77, pulse 86, temperature 98.6 F (37 C), temperature source Oral, resp. rate 18, last menstrual period 12/06/2016, SpO2 100 %, unknown if currently breastfeeding.  Physical Exam:  General: alert, cooperative and no distress Lochia: appropriate Uterine Fundus: firm DVT Evaluation: No evidence of DVT seen on physical exam.  Recent Labs    08/30/17 1924 08/31/17 0620  HGB 12.3 9.4*  HCT 36.7 27.2*    Assessment/Plan: Plan for discharge tomorrow   LOS: 1 day   Cam HaiSHAW, Jordon Bourquin CNM 08/31/2017, 9:17 AM

## 2017-08-31 NOTE — Lactation Note (Deleted)
This note was copied from a baby's chart. Lactation Consultation Note Baby 9 hrs old. Mom has tried to feed the baby several times but has no interest.  LC hand expressed  Drops of colostrum and rubbed on gums w/gloved finger. Baby has tight grip, clamping down. Took several attempts to get baby to suckle on gloved finger.  Placed at breast. Baby had no interest, wouldn't open. Placed baby laying on mom's chest STS.  Mom has large tubular breast. Short shaft areola. Some areola edema to Lt. Breast. Everts more w/stimulateion. Reverse pressure around Lt. Nipple helpful. Shells given to wear between feedings. Hand pump given to pre-pump prior to latching. Mom has 675 yr old daughter that she BF for 7 months. Mom stated her nipples were inverted then and had to wear a NS.   Reviewed newborn behavior, STS, I&O, cluster feeding. Mom encouraged to feed baby 8-12 times/24 hours and with feeding cues.  Encouraged mom if baby hasn't cued by 3 hrs. Stimulate to feed. Hand express and spoon feed. Baby has facial bruising around nose and mouth. Encouraged to call for assistance if needed,. WH/LC brochure given w/resources, support groups and LC services.  Patient Name: Shelley Devonne DoughtyRavonne Merkey Guzman's Date: 08/31/2017 Reason for consult: Initial assessment   Maternal Data Has patient been taught Hand Expression?: Yes Does the patient have breastfeeding experience prior to this delivery?: No  Feeding Feeding Type: Breast Milk Length of feed: 0 min  LATCH Score Latch: Too sleepy or reluctant, no latch achieved, no sucking elicited.  Audible Swallowing: None  Type of Nipple: Everted at rest and after stimulation(short shaft)  Comfort (Breast/Nipple): Soft / non-tender  Hold (Positioning): Full assist, staff holds infant at breast  LATCH Score: 4  Interventions Interventions: Breast feeding basics reviewed;Support pillows;Assisted with latch;Position options;Skin to skin;Expressed milk;Breast  massage;Hand express;Shells;Pre-pump if needed;Reverse pressure;Hand pump;Breast compression;Adjust position  Lactation Tools Discussed/Used Tools: Shells;Pump;Comfort gels Shell Type: Inverted Breast pump type: Manual Pump Review: Setup, frequency, and cleaning;Milk Storage Initiated by:: Peri JeffersonL. [name] RN IBCLC Date initiated:: 08/31/17   Consult Status Consult Status: Follow-up Date: 08/31/17 Follow-up type: In-patient    [name], Diamond NickelLAURA G 08/31/2017, 5:59 AM

## 2017-08-31 NOTE — Lactation Note (Addendum)
This note was copied from a baby's chart. Lactation Consultation Note Baby 9 hrs old. Mom stated has fed well. Mom holding baby rocking in her bed. Baby fussing. Asked mom when did baby feed last mom stated 2 hrs ago. Explained to mom baby appears to acting hungry. Mom trying to latch swaddled. Asked mom to un-swaddle baby to get closer and do STS.  Mom made didn't make much eye contact, she appeared not wanting to be bothered or want assistance. Mom trying to latch in cradle position w/blankets and her gown in the way. Attempted to adjust baby's blanket, mom kept pulling blanket over baby's hands. Mom made comment L that my hands were cold. LC used blanket to touch baby after that.  Baby started choking. Asked mom to sit baby up d/t choking. LC assisted in patting baby's back. Baby cont. To choke flaring arms and arching back. LC told mom "Let me see the baby he still choking."  LC placed arm around baby's abd. Bent baby over further and patted, mucous came out. LC used suction bulb. LC talking explaining what I was doing. Gave baby back to mom asking her to keep baby upright for a little while.  Mom encouraged to feed baby 8-12 times/24 hours and with feeding cues.  Encouraged mom to call if needs any assistance or questions. WH/LC brochure given w/resources, support groups and LC services.  Patient Name: Shelley Guzman: 08/31/2017 Reason for consult: Initial assessment   Maternal Data Has patient been taught Hand Expression?: Yes Does the patient have breastfeeding experience prior to this delivery?: No  Feeding Feeding Type: (S) (not done) Length of feed: 0 min  LATCH Score Latch: Too sleepy or reluctant, no latch achieved, no sucking elicited.  Audible Swallowing: None  Type of Nipple: Everted at rest and after stimulation(short shaft)  Comfort (Breast/Nipple): Soft / non-tender  Hold (Positioning): No assistance needed to correctly position infant at breast.(mom  didn't want assistance)  LATCH Score: 6  Interventions Interventions: Breast feeding basics reviewed;Support pillows;Adjust position  Lactation Tools Discussed/Used Tools: (not given) Shell Type: (not given) Breast pump type: (not given) Pump Review: (S) (not done) Initiated by:: (not done) Guzman initiated:: (not done)   Consult Status Consult Status: Follow-up Guzman: 08/31/17 Follow-up type: In-patient    Charyl DancerCARVER, Sheddrick Lattanzio G 08/31/2017, 6:28 AM

## 2017-09-01 DIAGNOSIS — R030 Elevated blood-pressure reading, without diagnosis of hypertension: Secondary | ICD-10-CM | POA: Diagnosis not present

## 2017-09-01 DIAGNOSIS — F1290 Cannabis use, unspecified, uncomplicated: Secondary | ICD-10-CM | POA: Diagnosis present

## 2017-09-01 DIAGNOSIS — D62 Acute posthemorrhagic anemia: Secondary | ICD-10-CM | POA: Diagnosis not present

## 2017-09-01 MED ORDER — IBUPROFEN 600 MG PO TABS: 600.0000 mg | ORAL_TABLET | Freq: Four times a day (QID) | ORAL | 0 refills | Status: AC

## 2017-09-01 NOTE — Discharge Summary (Signed)
Physician Discharge Summary  Patient ID: Shelley Guzman MRN: [id number] DOB/AGE: 1995-12-13 22 y.o.  Admit date: 08/30/2017 Discharge date: 09/01/2017   Discharge Diagnoses:  Principal Problem:   Spontaneous vaginal delivery Active Problems:   Normal labor   Blood pressure elevated without history of HTN   Acute blood loss anemia   Marijuana use   Hospital Course: Please see HPI dated 08/30/2017 for details. This is a 22 y.o. G1P1001 now PPD#2 from a spontaneous vaginal delivery. She presented in spontaneous labor. Her antepartum course was uncomplicated. Noted to use THC. Her post partum course was uncomplicated. By day 2, she was ambulating, passing flatus and pain was well controlled. SW was consulted for h/o THC use but she was not able to be seen, infant with negative UDS. She was discharged home HD# in good condition. Baby is doing well and will be discharged home with patient. Instructions for follow up given. She will also follow up in the office for a blood pressure check for mildly elevated BP intrapartum with normal BP post partum.   Physical exam  Vitals:   08/31/17 0319 08/31/17 1100 08/31/17 1545 08/31/17 2145  BP: 135/77 122/75 114/71 123/65  Pulse: 86 93 88 82  Resp: 18 18 20 18   Temp: 98.6 F (37 C) 98.9 F (37.2 C) 98.5 F (36.9 C) 98.3 F (36.8 C)  TempSrc: Oral Oral Oral Oral  SpO2: 100%   100%   Physical Exam:  General: alert, oriented, cooperative Chest: CTAB, normal respiratory effort Heart: RRR  Abdomen: +BS, soft, appropriately tender to palpation  Uterine Fundus: firm, 2 fingers below the umbilicus Lochia: moderate, rubra DVT Evaluation: no evidence of DVT Extremities: no edema, no calf tenderness   Postpartum contraception: Undecided  Disposition: home  Discharged Condition: good  Discharge Instructions    Call MD for:   Complete by:  As directed    Call MD for:  difficulty breathing, headache or visual disturbances   Complete by:  As  directed    Call MD for:  extreme fatigue   Complete by:  As directed    Call MD for:  hives   Complete by:  As directed    Call MD for:  persistant dizziness or light-headedness   Complete by:  As directed    Call MD for:  persistant nausea and vomiting   Complete by:  As directed    Call MD for:  redness, tenderness, or signs of infection (pain, swelling, redness, odor or green/yellow discharge around incision site)   Complete by:  As directed    Call MD for:  severe uncontrolled pain   Complete by:  As directed    Call MD for:  temperature >100.4   Complete by:  As directed    Diet - low sodium heart healthy   Complete by:  As directed      Allergies as of 09/01/2017   No Known Allergies     Medication List    TAKE these medications   ibuprofen 600 MG tablet Commonly known as:  ADVIL,MOTRIN Take 1 tablet (600 mg total) by mouth every 6 (six) hours.   prenatal vitamin w/FE, FA 27-1 MG Tabs tablet Take 1 tablet by mouth daily at 12 noon.      Follow-up Information    [facility]. Schedule an appointment as soon as possible for a visit in 4 week(s).   Specialty:  Obstetrics and Gynecology Contact information: [address] [place]  [place] [id number] [phone]          Signed: Baldemar LenisK. Meryl Davis, M.D. Attending Obstetrician & Gynecologist, [facility], [facility]  09/01/2017, 9:32 AM

## 2017-09-01 NOTE — Lactation Note (Signed)
This note was copied from a baby's chart. Lactation Consultation Note  Patient Name: Shelley Guzman WGNFA'OToday's Date: 09/01/2017 Reason for consult: Follow-up assessment   Baby 38 hours old and latched upon entering. Sucks and swallows intermittent. Provided mother w/ manual pump for going home. Encouraged her to compress her breast during feeding.   Mom encouraged to feed baby 8-12 times/24 hours and with feeding cues.  Reviewed engorgement care and monitoring voids/stools.    Maternal Data    Feeding Feeding Type: Breast Fed Length of feed: 8 min  LATCH Score Latch: Grasps breast easily, tongue down, lips flanged, rhythmical sucking.(latched upon entering)  Audible Swallowing: A few with stimulation  Type of Nipple: Everted at rest and after stimulation  Comfort (Breast/Nipple): Soft / non-tender  Hold (Positioning): Assistance needed to correctly position infant at breast and maintain latch.  LATCH Score: 8  Interventions Interventions: Breast feeding basics reviewed;Hand pump;Breast compression  Lactation Tools Discussed/Used     Consult Status Consult Status: Complete    Hardie PulleyBerkelhammer, Ruth Boschen 09/01/2017, 10:42 AM

## 2017-09-01 NOTE — Discharge Instructions (Signed)
Vaginal Delivery, Care After °Refer to this sheet in the next few weeks. These instructions provide you with information about caring for yourself after vaginal delivery. Your health care provider may also give you more specific instructions. Your treatment has been planned according to current medical practices, but problems sometimes occur. Call your health care provider if you have any problems or questions. °What can I expect after the procedure? °After vaginal delivery, it is common to have: °· Some bleeding from your vagina. °· Soreness in your abdomen, your vagina, and the area of skin between your vaginal opening and your anus (perineum). °· Pelvic cramps. °· Fatigue. ° °Follow these instructions at home: °Medicines °· Take over-the-counter and prescription medicines only as told by your health care provider. °· If you were prescribed an antibiotic medicine, take it as told by your health care provider. Do not stop taking the antibiotic until it is finished. °Driving ° °· Do not drive or operate heavy machinery while taking prescription pain medicine. °· Do not drive for 24 hours if you received a sedative. °Lifestyle °· Do not drink alcohol. This is especially important if you are breastfeeding or taking medicine to relieve pain. °· Do not use tobacco products, including cigarettes, chewing tobacco, or e-cigarettes. If you need help quitting, ask your health care provider. °Eating and drinking °· Drink at least 8 eight-ounce glasses of water every day unless you are told not to by your health care provider. If you choose to breastfeed your baby, you may need to drink more water than this. °· Eat high-fiber foods every day. These foods may help prevent or relieve constipation. High-fiber foods include: °? Whole grain cereals and breads. °? Brown rice. °? Beans. °? Fresh fruits and vegetables. °Activity °· Return to your normal activities as told by your health care provider. Ask your health care provider  what activities are safe for you. °· Rest as much as possible. Try to rest or take a nap when your baby is sleeping. °· Do not lift anything that is heavier than your baby or 10 lb (4.5 kg) until your health care provider says that it is safe. °· Talk with your health care provider about when you can engage in sexual activity. This may depend on your: °? Risk of infection. °? Rate of healing. °? Comfort and desire to engage in sexual activity. °Vaginal Care °· If you have an episiotomy or a vaginal tear, check the area every day for signs of infection. Check for: °? More redness, swelling, or pain. °? More fluid or blood. °? Warmth. °? Pus or a bad smell. °· Do not use tampons or douches until your health care provider says this is safe. °· Watch for any blood clots that may pass from your vagina. These may look like clumps of dark red, brown, or black discharge. °General instructions °· Keep your perineum clean and dry as told by your health care provider. °· Wear loose, comfortable clothing. °· Wipe from front to back when you use the toilet. °· Ask your health care provider if you can shower or take a bath. If you had an episiotomy or a perineal tear during labor and delivery, your health care provider may tell you not to take baths for a certain length of time. °· Wear a bra that supports your breasts and fits you well. °· If possible, have someone help you with household activities and help care for your baby for at least a few days after   you leave the hospital. °· Keep all follow-up visits for you and your baby as told by your health care provider. This is important. °Contact a health care provider if: °· You have: °? Vaginal discharge that has a bad smell. °? Difficulty urinating. °? Pain when urinating. °? A sudden increase or decrease in the frequency of your bowel movements. °? More redness, swelling, or pain around your episiotomy or vaginal tear. °? More fluid or blood coming from your episiotomy or  vaginal tear. °? Pus or a bad smell coming from your episiotomy or vaginal tear. °? A fever. °? A rash. °? Little or no interest in activities you used to enjoy. °? Questions about caring for yourself or your baby. °· Your episiotomy or vaginal tear feels warm to the touch. °· Your episiotomy or vaginal tear is separating or does not appear to be healing. °· Your breasts are painful, hard, or turn red. °· You feel unusually sad or worried. °· You feel nauseous or you vomit. °· You pass large blood clots from your vagina. If you pass a blood clot from your vagina, save it to show to your health care provider. Do not flush blood clots down the toilet without having your health care provider look at them. °· You urinate more than usual. °· You are dizzy or light-headed. °· You have not breastfed at all and you have not had a menstrual period for 12 weeks after delivery. °· You have stopped breastfeeding and you have not had a menstrual period for 12 weeks after you stopped breastfeeding. °Get help right away if: °· You have: °? Pain that does not go away or does not get better with medicine. °? Chest pain. °? Difficulty breathing. °? Blurred vision or spots in your vision. °? Thoughts about hurting yourself or your baby. °· You develop pain in your abdomen or in one of your legs. °· You develop a severe headache. °· You faint. °· You bleed from your vagina so much that you fill two sanitary pads in one hour. °This information is not intended to replace advice given to you by your health care provider. Make sure you discuss any questions you have with your health care provider. °Document Released: 02/26/2000 Document Revised: 08/12/2015 Document Reviewed: 03/15/2015 °Elsevier Interactive Patient Education © 2018 [employer] ° °

## 2017-09-07 ENCOUNTER — Telehealth: Payer: Self-pay | Admitting: General Practice

## 2017-09-07 NOTE — Telephone Encounter (Signed)
Patient states that a Home Nurse checked her BP on 09/06/2017 and was normal. Patient was given phne # to contact [facility] office if needed.

## 2018-10-29 IMAGING — US US OB COMP LESS 14 WK
1 series · 15 of 28 positions shown · non-contrast
Comparison: None.

CLINICAL DATA: 21-year-old pregnant female with low back and
abdominal pain. Quantitative beta HCG [DATE].

EDC by LMP: 09/12/2017, projecting to an expected gestational age of
7 weeks 3 days.
EXAM:
OBSTETRIC <14 WK US AND TRANSVAGINAL OB US
TECHNIQUE: Both transabdominal and transvaginal ultrasound examinations were
performed for complete evaluation of the gestation as well as the
maternal uterus, adnexal regions, and pelvic cul-de-sac.
Transvaginal technique was performed to assess early pregnancy.

[Series 1: us ob comp less 14 wk · 15 of 52 slices shown]
[im 1/52]
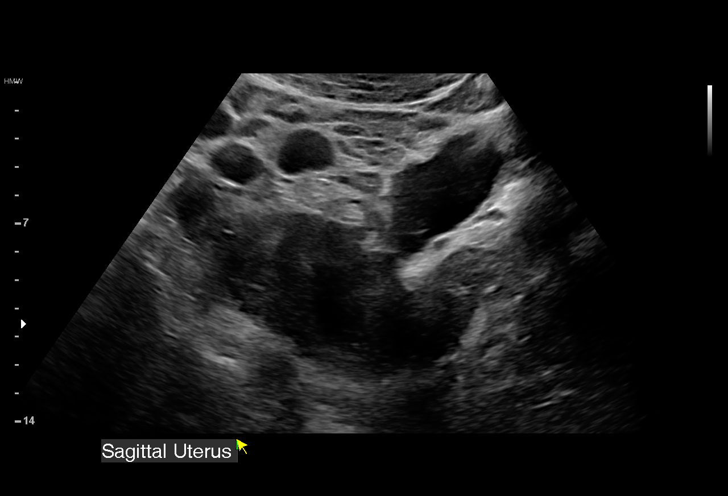
[im 4/52]
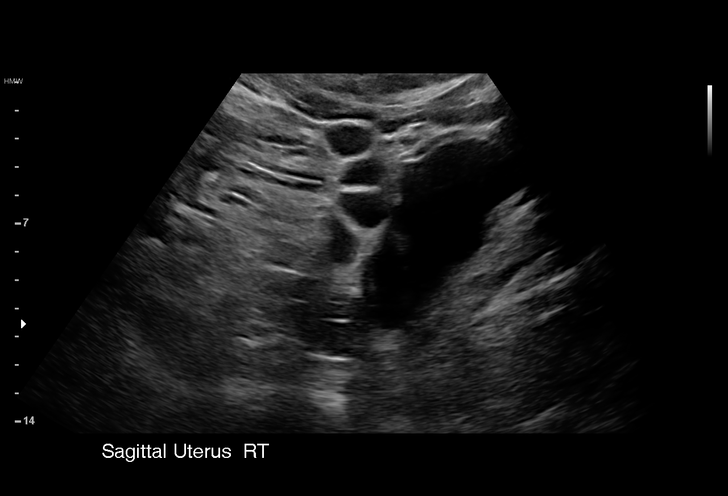
[im 8/52]
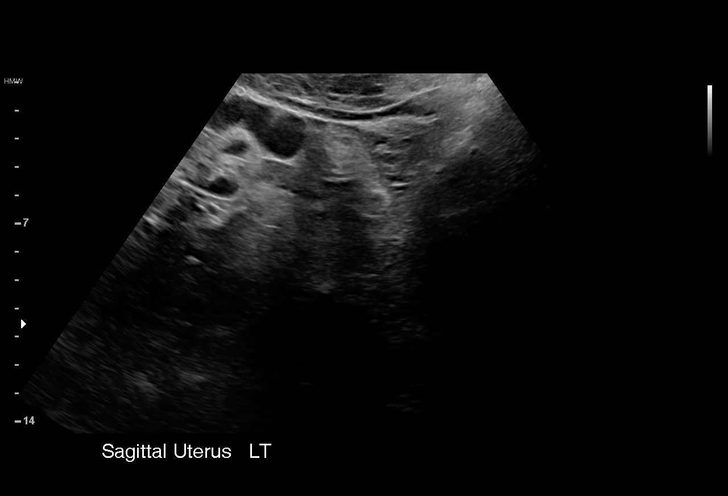
[im 12/52]
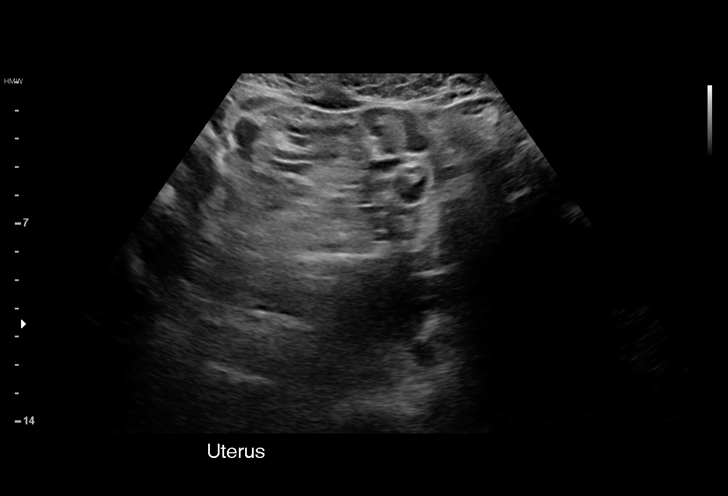
[im 16/52]
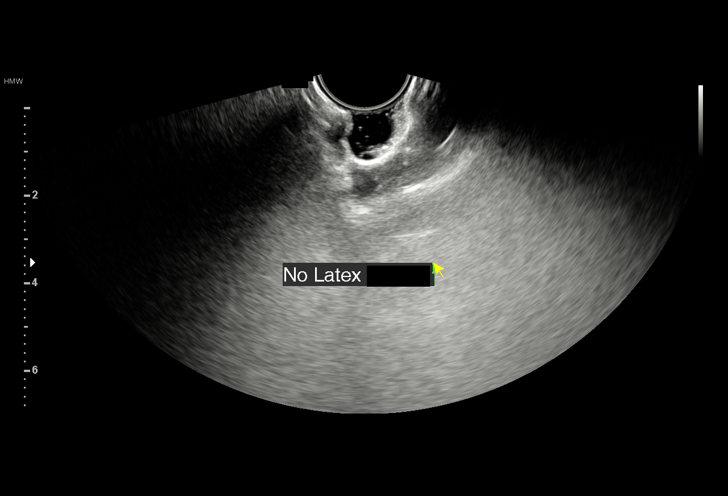
[im 19/52]
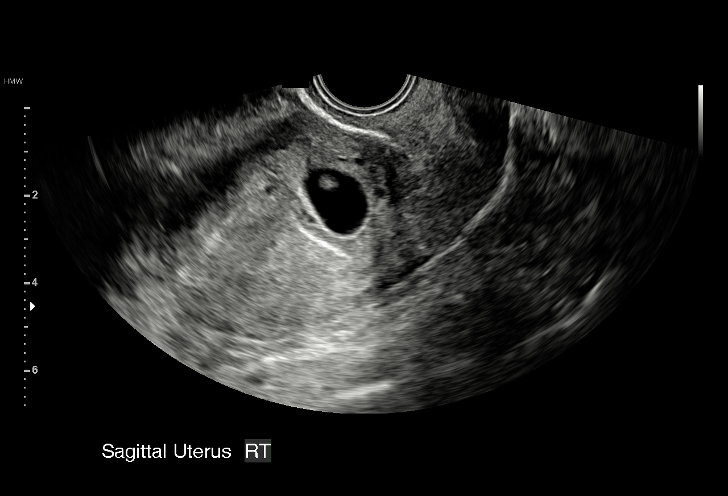
[im 23/52]
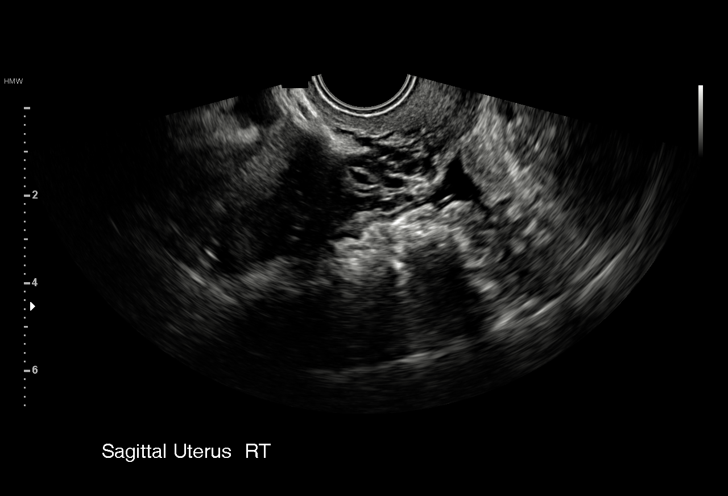
[im 27/52]
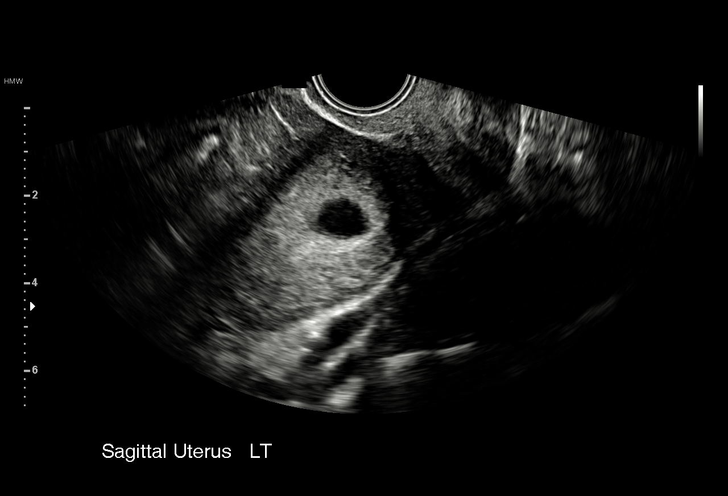
[im 29/52]
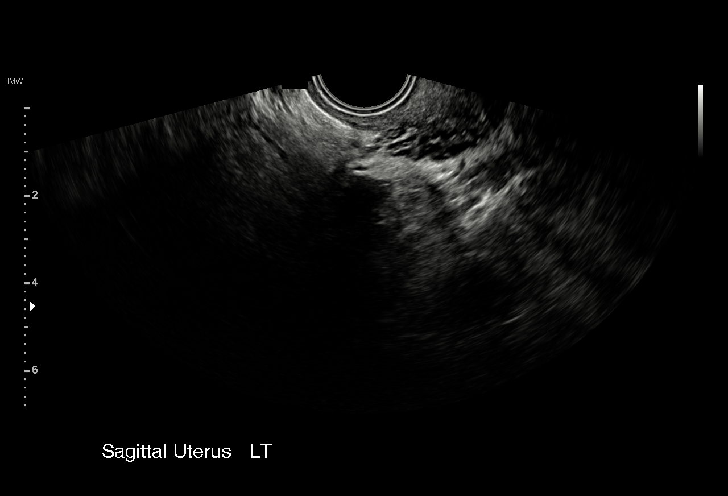
[im 33/52]
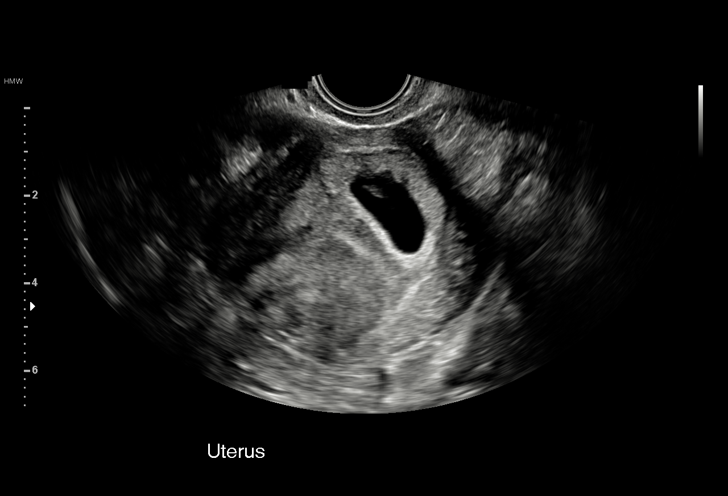
[im 36/52]
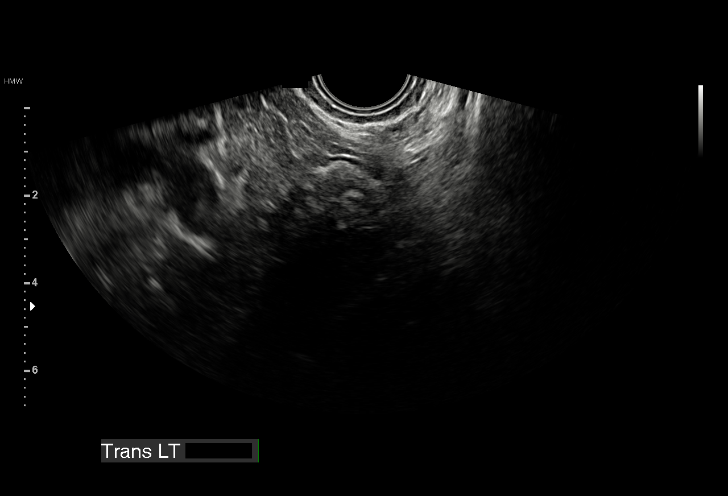
[im 40/52]
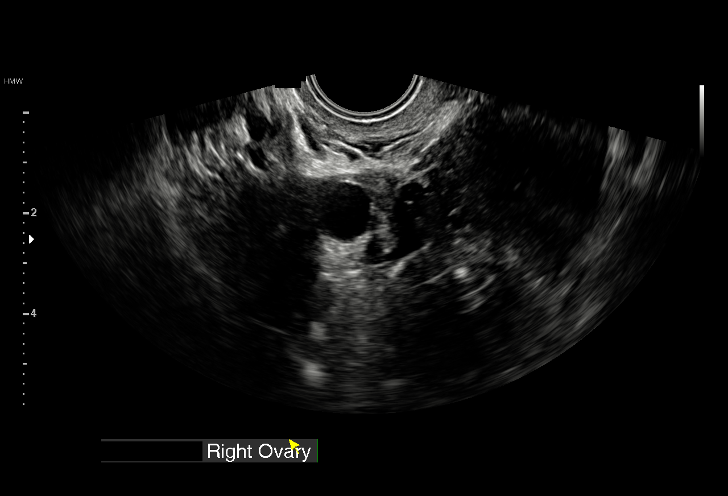
[im 44/52]
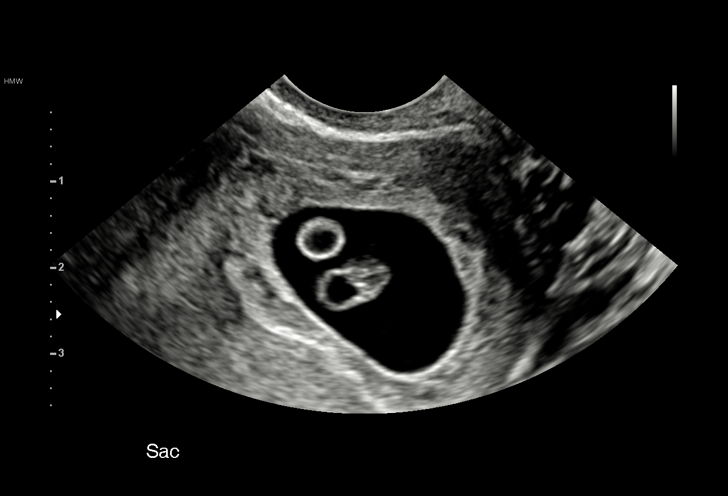
[im 48/52]
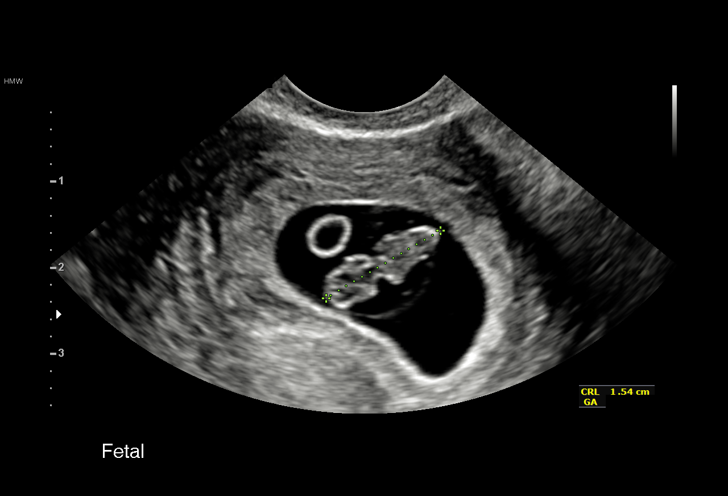
[im 52/52]
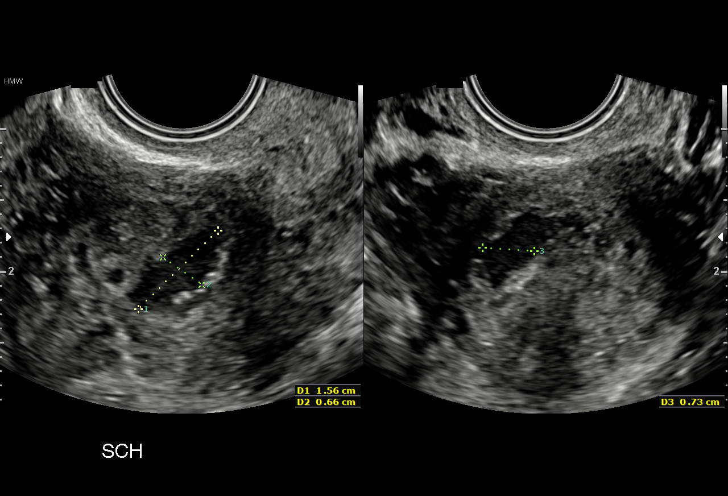

[15 of 28 positions shown; findings below may reference images not displayed]

FINDINGS: Intrauterine gestational sac: Single intrauterine gestational sac is
normal in size, shape and position.

Yolk sac:  Visualized.

Embryo:  Visualized.

Embryonic Cardiac Activity: Regular rate and rhythm.

Embryonic Heart Rate: 161  bpm

CRL:  15.3  mm   7 w   6 d                  US EDC: 09/09/2017

Subchorionic hemorrhage: Small perigestational bleed in the lower
cavity measures 1.6 x 0.7 x 0.7 cm.

Maternal uterus/adnexae: Right ovary measures 2.8 x 1.9 x 1.6 cm.
Left ovary is not visualized. No abnormal adnexal masses. No
abnormal free fluid in the pelvis.
IMPRESSION: 1. Single living intrauterine gestation at 7 weeks 6 days by
crown-rump length, concordant with provided menstrual dating.
2. Small perigestational bleed in the lower cavity. Normal embryonic
cardiac activity .
3. Normal right ovary. Nonvisualization of left ovary. No adnexal
masses.
